# Patient Record
Sex: Female | Born: 1990 | Race: White | Hispanic: Yes | Marital: Single | Smoking: Never smoker
Health system: Southern US, Community
[De-identification: ages and names within clinical notes are randomized; demographics above are authoritative.]

## PROBLEM LIST (undated history)

## (undated) DIAGNOSIS — K219 Gastro-esophageal reflux disease without esophagitis: Secondary | ICD-10-CM

## (undated) HISTORY — DX: Gastro-esophageal reflux disease without esophagitis: K21.9

---

## 2013-05-05 ENCOUNTER — Encounter (HOSPITAL_COMMUNITY): Payer: Self-pay | Admitting: Emergency Medicine

## 2013-05-05 ENCOUNTER — Emergency Department (HOSPITAL_COMMUNITY)
Admission: EM | Admit: 2013-05-05 | Discharge: 2013-05-06 | Disposition: A | Discharge status: Home / Self Care | Payer: BC Managed Care – PPO | Attending: Emergency Medicine | Admitting: Emergency Medicine

## 2013-05-05 DIAGNOSIS — F17200 Nicotine dependence, unspecified, uncomplicated: Secondary | ICD-10-CM | POA: Insufficient documentation

## 2013-05-05 DIAGNOSIS — Z3202 Encounter for pregnancy test, result negative: Secondary | ICD-10-CM | POA: Insufficient documentation

## 2013-05-05 DIAGNOSIS — F10929 Alcohol use, unspecified with intoxication, unspecified: Secondary | ICD-10-CM

## 2013-05-05 DIAGNOSIS — F1010 Alcohol abuse, uncomplicated: Secondary | ICD-10-CM | POA: Insufficient documentation

## 2013-05-05 MED ORDER — ONDANSETRON HCL 4 MG/2ML IJ SOLN
4.0000 mg | Freq: Once | INTRAMUSCULAR | Status: AC
  Administered 2013-05-06: 4 mg via INTRAVENOUS
  Filled 2013-05-05: qty 2

## 2013-05-05 MED ORDER — NALOXONE HCL 0.4 MG/ML IJ SOLN
0.4000 mg | Freq: Once | INTRAMUSCULAR | Status: AC
  Administered 2013-05-06: 0.4 mg via INTRAVENOUS
  Filled 2013-05-05: qty 1

## 2013-05-05 MED ORDER — SODIUM CHLORIDE 0.9 % IV BOLUS (SEPSIS)
1000.0000 mL | Freq: Once | INTRAVENOUS | Status: AC
  Administered 2013-05-05: 1000 mL via INTRAVENOUS

## 2013-05-05 NOTE — ED Notes (Signed)
Bed: [id number] Expected date:  Expected time:  Means of arrival:  Comments: EMS ETOH, dec LOC

## 2013-05-05 NOTE — ED Notes (Signed)
Pt arrived to the ED via EMS with a complaint of alcohol intoxication.  Pt was going home with friends after drinking tequela when she passed out and became unresponsive.  She has been unresponsive for EMS.  EMS put a nasal airway in which they stated the pt responded to.  Pt has no identification but friends are present.

## 2013-05-05 NOTE — ED Provider Notes (Signed)
CSN: [id number]     Arrival date & time 05/05/13  2338 History   First MD Initiated Contact with Patient 05/05/13 2347     Chief Complaint  Patient presents with  . Alcohol Intoxication     (Consider location/radiation/quality/duration/timing/severity/associated sxs/prior Treatment) HPI History provided by EMS. Call out for alcohol intoxication, was apparently at a college party drinking with friends when she passed out and became unresponsive. Per EMS patient not answering questions, tolerated a nasal pharyngeal airway, and was brought into the emergency room. Friends bedside do not believe that she fell or injured herself are unable to say. There also unaware of any other potential ingestions. Level V caveat applies - patient unresponsive and unable to provide any history  History reviewed. No pertinent past medical history. History reviewed. No pertinent past surgical history. History reviewed. No pertinent family history. History  Substance Use Topics  . Smoking status: Current Some Day Smoker  . Smokeless tobacco: Not on file  . Alcohol Use: Yes   OB History   Grav Para Term Preterm Abortions TAB SAB Ect Mult Living                 Review of Systems  Unable to perform ROS  Level V caveat applies as above   Allergies  Review of patient's allergies indicates not on file.  Home Medications  No current outpatient prescriptions on file. BP 114/60  Pulse 66  Resp 16  SpO2 98% Physical Exam  Constitutional: She appears well-developed and well-nourished.  HENT:  Head: Normocephalic and atraumatic.  Nasopharyngeal airway in place  Eyes: EOM are normal.  Pupils 3 mm equal bilaterally  Neck: Neck supple. No tracheal deviation present.  Cardiovascular: Normal rate, regular rhythm and intact distal pulses.   Pulmonary/Chest: Effort normal and breath sounds normal. No stridor. No respiratory distress.  Abdominal: Soft. She exhibits no distension.  Musculoskeletal:  Equal  peripheral pulses without any obvious areas of deformity or abrasions   Neurological:  Unresponsive, intoxicated  Skin: Skin is warm and dry.    ED Course  Procedures (including critical care time) Labs Review Labs Reviewed  CBC - Abnormal; Notable for the following:    WBC 10.8 (*)    All other components within normal limits  URINALYSIS, ROUTINE W REFLEX MICROSCOPIC - Abnormal; Notable for the following:    Hgb urine dipstick TRACE (*)    All other components within normal limits  ETHANOL - Abnormal; Notable for the following:    Alcohol, Ethyl (B) 329 (*)    All other components within normal limits  POCT I-STAT, CHEM 8 - Abnormal; Notable for the following:    Creatinine, Ser 1.20 (*)    Glucose, Bld 106 (*)    Calcium, Ion 1.00 (*)    All other components within normal limits  URINE RAPID DRUG SCREEN (HOSP PERFORMED)  URINE MICROSCOPIC-ADD ON  POCT PREGNANCY, URINE   Imaging Review Ct Head Wo Contrast  05/06/2013   CLINICAL DATA:  Alcohol intoxication, syncope  EXAM: CT HEAD WITHOUT CONTRAST  TECHNIQUE: Contiguous axial images were obtained from the base of the skull through the vertex without intravenous contrast.  COMPARISON:  None.  FINDINGS: Negative for acute intracranial hemorrhage, acute infarction, mass, mass effect, hydrocephalus or midline shift. Gray-white differentiation is preserved throughout. No acute soft tissue or calvarial abnormality. The globes and orbits are symmetric and unremarkable. Normal aeration of the mastoid air cells and visualized paranasal sinuses.  IMPRESSION: Negative head CT.  Electronically Signed   By: Malachy MoanHeath  McCullough M.D.   On: 05/06/2013 02:25   CRITICAL CARE Performed by: Sunnie NielsenPITZ,Zion Lint Total critical care time: 45 Critical care time was exclusive of separately billable procedures and treating other patients. Critical care was necessary to treat or prevent imminent or life-threatening deterioration. Critical care was time spent  personally by me on the following activities: development of treatment plan with patient and/or surrogate as well as nursing, discussions with consultants, evaluation of patient's response to treatment, examination of patient, obtaining history from patient or surrogate, ordering and performing treatments and interventions, ordering and review of laboratory studies, ordering and review of radiographic studies, pulse oximetry and re-evaluation of patient's condition. IV fluids provided. IV Narcan without response. CT brain obtained no intracranial catastrophe. 3:07 AM recheck unchanged, blood alcohol level noted as above 329 6:13 AM patient waking up some, answering questions appropriately, moving all extremities x4. Plan allow patient to sober and reassess.  MDM   Diagnosis: Alcohol intoxication  Workup as above for unresponsiveness with IV Narcan, IV fluids, IV Zofran, CT head, labs as above. Alcohol 329.   Sunnie NielsenBrian Takiyah Bohnsack, MD 05/08/13 [phone]

## 2013-05-06 ENCOUNTER — Emergency Department (HOSPITAL_COMMUNITY): Payer: BC Managed Care – PPO

## 2013-05-06 LAB — RAPID URINE DRUG SCREEN, HOSP PERFORMED
Amphetamines: NOT DETECTED
Barbiturates: NOT DETECTED
Benzodiazepines: NOT DETECTED
Cocaine: NOT DETECTED
Opiates: NOT DETECTED
TETRAHYDROCANNABINOL: NOT DETECTED

## 2013-05-06 LAB — CBC
HCT: 39.2 % (ref 36.0–46.0)
Hemoglobin: 13 g/dL (ref 12.0–15.0)
MCH: 30.4 pg (ref 26.0–34.0)
MCHC: 33.2 g/dL (ref 30.0–36.0)
MCV: 91.6 fL (ref 78.0–100.0)
PLATELETS: 265 10*3/uL (ref 150–400)
RBC: 4.28 MIL/uL (ref 3.87–5.11)
RDW: 12.2 % (ref 11.5–15.5)
WBC: 10.8 10*3/uL — ABNORMAL HIGH (ref 4.0–10.5)

## 2013-05-06 LAB — URINALYSIS, ROUTINE W REFLEX MICROSCOPIC
Bilirubin Urine: NEGATIVE
Glucose, UA: NEGATIVE mg/dL
Ketones, ur: NEGATIVE mg/dL
Leukocytes, UA: NEGATIVE
NITRITE: NEGATIVE
Protein, ur: NEGATIVE mg/dL
Specific Gravity, Urine: 1.02 (ref 1.005–1.030)
Urobilinogen, UA: 0.2 mg/dL (ref 0.0–1.0)
pH: 5 (ref 5.0–8.0)

## 2013-05-06 LAB — POCT I-STAT, CHEM 8
BUN: 18 mg/dL (ref 6–23)
CALCIUM ION: 1 mmol/L — AB (ref 1.12–1.23)
Chloride: 107 mEq/L (ref 96–112)
Creatinine, Ser: 1.2 mg/dL — ABNORMAL HIGH (ref 0.50–1.10)
Glucose, Bld: 106 mg/dL — ABNORMAL HIGH (ref 70–99)
HCT: 41 % (ref 36.0–46.0)
HEMOGLOBIN: 13.9 g/dL (ref 12.0–15.0)
Potassium: 4.5 mEq/L (ref 3.7–5.3)
SODIUM: 143 meq/L (ref 137–147)
TCO2: 23 mmol/L (ref 0–100)

## 2013-05-06 LAB — POCT PREGNANCY, URINE: Preg Test, Ur: NEGATIVE

## 2013-05-06 LAB — URINE MICROSCOPIC-ADD ON

## 2013-05-06 LAB — ETHANOL: Alcohol, Ethyl (B): 329 mg/dL — ABNORMAL HIGH (ref 0–11)

## 2013-05-06 MED ORDER — ONDANSETRON HCL 4 MG PO TABS: 4.0000 mg | ORAL_TABLET | Freq: Four times a day (QID) | ORAL | Status: DC

## 2013-05-06 NOTE — ED Notes (Signed)
Pt to nearby restroom via wheelchair to void and ambulated from bathroom to room/on way back.

## 2013-05-06 NOTE — ED Notes (Signed)
Nursing attempted to call Pt's friend to come and pick them up.  Pt's friend did not respond to call

## 2013-05-06 NOTE — ED Notes (Signed)
Pt's friend was tried to call again but again no response.

## 2013-05-06 NOTE — Discharge Instructions (Signed)
Alcohol Problems °Most adults who drink alcohol drink in moderation (not a lot) are at low risk for developing problems related to their drinking. However, all drinkers, including low-risk drinkers, should know about the health risks connected with drinking alcohol. °RECOMMENDATIONS FOR LOW-RISK DRINKING  °Drink in moderation. Moderate drinking is defined as follows:  °· Men - no more than 2 drinks per day. °· Nonpregnant women - no more than 1 drink per day. °· Over age 65 - no more than 1 drink per day. °A standard drink is 12 grams of pure alcohol, which is equal to a 12 ounce bottle of beer or wine cooler, a 5 ounce glass of wine, or 1.5 ounces of distilled spirits (such as whiskey, brandy, vodka, or rum).  °ABSTAIN FROM (DO NOT DRINK) ALCOHOL: °· When pregnant or considering pregnancy. °· When taking a medication that interacts with alcohol. °· If you are alcohol dependent. °· A medical condition that prohibits drinking alcohol (such as ulcer, liver disease, or heart disease). °DISCUSS WITH YOUR CAREGIVER: °· If you are at risk for coronary heart disease, discuss the potential benefits and risks of alcohol use: Light to moderate drinking is associated with lower rates of coronary heart disease in certain populations (for example, men over age 45 and postmenopausal women). Infrequent or nondrinkers are advised not to begin light to moderate drinking to reduce the risk of coronary heart disease so as to avoid creating an alcohol-related problem. Similar protective effects can likely be gained through proper diet and exercise. °· Women and the elderly have smaller amounts of body water than men. As a result women and the elderly achieve a higher blood alcohol concentration after drinking the same amount of alcohol. °· Exposing a fetus to alcohol can cause a broad range of birth defects referred to as Fetal Alcohol Syndrome (FAS) or Alcohol-Related Birth Defects (ARBD). Although FAS/ARBD is connected with excessive  alcohol consumption during pregnancy, studies also have reported neurobehavioral problems in infants born to mothers reporting drinking an average of 1 drink per day during pregnancy. °· Heavier drinking (the consumption of more than 4 drinks per occasion by men and more than 3 drinks per occasion by women) impairs learning (cognitive) and psychomotor functions and increases the risk of alcohol-related problems, including accidents and injuries. °CAGE QUESTIONS:  °· Have you ever felt that you should Cut down on your drinking? °· Have people Annoyed you by criticizing your drinking? °· Have you ever felt bad or Guilty about your drinking? °· Have you ever had a drink first thing in the morning to steady your nerves or get rid of a hangover (Eye opener)? °If you answered positively to any of these questions: You may be at risk for alcohol-related problems if alcohol consumption is:  °· Men: Greater than 14 drinks per week or more than 4 drinks per occasion. °· Women: Greater than 7 drinks per week or more than 3 drinks per occasion. °Do you or your family have a medical history of alcohol-related problems, such as: °· Blackouts. °· Sexual dysfunction. °· Depression. °· Trauma. °· Liver dysfunction. °· Sleep disorders. °· Hypertension. °· Chronic abdominal pain. °· Has your drinking ever caused you problems, such as problems with your family, problems with your work (or school) performance, or accidents/injuries? °· Do you have a compulsion to drink or a preoccupation with drinking? °· Do you have poor control or are you unable to stop drinking once you have started? °· Do you have to drink to   avoid withdrawal symptoms? °· Do you have problems with withdrawal such as tremors, nausea, sweats, or mood disturbances? °· Does it take more alcohol than in the past to get you high? °· Do you feel a strong urge to drink? °· Do you change your plans so that you can have a drink? °· Do you ever drink in the morning to relieve  the shakes or a hangover? °If you have answered a number of the previous questions positively, it may be time for you to talk to your caregivers, family, and friends and see if they think you have a problem. Alcoholism is a chemical dependency that keeps getting worse and will eventually destroy your health and relationships. Many alcoholics end up dead, impoverished, or in prison. This is often the end result of all chemical dependency. °· Do not be discouraged if you are not ready to take action immediately. °· Decisions to change behavior often involve up and down desires to change and feeling like you cannot decide. °· Try to think more seriously about your drinking behavior. °· Think of the reasons to quit. °WHERE TO GO FOR ADDITIONAL INFORMATION  °· The National Institute on Alcohol Abuse and Alcoholism (NIAAA) °www.niaaa.nih.gov °· National Council on Alcoholism and Drug Dependence (NCADD) °www.ncadd.org °· American Society of Addiction Medicine (ASAM) °www.asam.org  °Document Released: 03/09/2005 Document Revised: 06/01/2011 Document Reviewed: 10/26/2007 °ExitCare® Patient Information ©2014 [employer]. ° °

## 2013-05-06 NOTE — ED Notes (Signed)
Pt's friend arrived and asked when the possible discharge of the pt would be.  Pt's friend was informed that it was undetermined at this time but that patient would have to be more coherent in order to be discharged.   Pt's friend left her name and number and asked that we call her when pt was discharged and she would pick her up and take her home.  Pt's friend stated she is a [employer] student [place].    Pt's friends name is: Riley Killlicia Coteman [phone]

## 2013-05-24 ENCOUNTER — Encounter (HOSPITAL_COMMUNITY): Payer: Self-pay | Admitting: Emergency Medicine

## 2013-05-24 ENCOUNTER — Emergency Department (HOSPITAL_COMMUNITY)
Admission: EM | Admit: 2013-05-24 | Discharge: 2013-05-24 | Disposition: A | Discharge status: Home / Self Care | Payer: BC Managed Care – HMO | Attending: Emergency Medicine | Admitting: Emergency Medicine

## 2013-05-24 ENCOUNTER — Emergency Department (HOSPITAL_COMMUNITY): Payer: BC Managed Care – HMO

## 2013-05-24 DIAGNOSIS — Z3202 Encounter for pregnancy test, result negative: Secondary | ICD-10-CM | POA: Insufficient documentation

## 2013-05-24 DIAGNOSIS — K8020 Calculus of gallbladder without cholecystitis without obstruction: Secondary | ICD-10-CM | POA: Insufficient documentation

## 2013-05-24 LAB — CBC WITH DIFFERENTIAL/PLATELET
Basophils Absolute: 0 10*3/uL (ref 0.0–0.1)
Basophils Relative: 0 % (ref 0–1)
Eosinophils Absolute: 0.1 10*3/uL (ref 0.0–0.7)
Eosinophils Relative: 1 % (ref 0–5)
HEMATOCRIT: 38.1 % (ref 36.0–46.0)
Hemoglobin: 13.2 g/dL (ref 12.0–15.0)
LYMPHS PCT: 17 % (ref 12–46)
Lymphs Abs: 1.7 10*3/uL (ref 0.7–4.0)
MCH: 31.7 pg (ref 26.0–34.0)
MCHC: 34.6 g/dL (ref 30.0–36.0)
MCV: 91.4 fL (ref 78.0–100.0)
MONOS PCT: 6 % (ref 3–12)
Monocytes Absolute: 0.5 10*3/uL (ref 0.1–1.0)
NEUTROS ABS: 7.5 10*3/uL (ref 1.7–7.7)
Neutrophils Relative %: 77 % (ref 43–77)
Platelets: 221 10*3/uL (ref 150–400)
RBC: 4.17 MIL/uL (ref 3.87–5.11)
RDW: 12.4 % (ref 11.5–15.5)
WBC: 9.7 10*3/uL (ref 4.0–10.5)

## 2013-05-24 LAB — LIPASE, BLOOD: Lipase: 38 U/L (ref 11–59)

## 2013-05-24 LAB — URINALYSIS, ROUTINE W REFLEX MICROSCOPIC
Glucose, UA: NEGATIVE mg/dL
KETONES UR: NEGATIVE mg/dL
LEUKOCYTES UA: NEGATIVE
Nitrite: NEGATIVE
PROTEIN: 30 mg/dL — AB
Specific Gravity, Urine: 1.031 — ABNORMAL HIGH (ref 1.005–1.030)
Urobilinogen, UA: 0.2 mg/dL (ref 0.0–1.0)
pH: 5.5 (ref 5.0–8.0)

## 2013-05-24 LAB — HEPATIC FUNCTION PANEL
ALK PHOS: 64 U/L (ref 39–117)
ALT: 29 U/L (ref 0–35)
AST: 50 U/L — AB (ref 0–37)
Albumin: 3.7 g/dL (ref 3.5–5.2)
BILIRUBIN TOTAL: 0.4 mg/dL (ref 0.3–1.2)
Total Protein: 7.4 g/dL (ref 6.0–8.3)

## 2013-05-24 LAB — BASIC METABOLIC PANEL
BUN: 11 mg/dL (ref 6–23)
CALCIUM: 9.3 mg/dL (ref 8.4–10.5)
CO2: 24 meq/L (ref 19–32)
Chloride: 104 mEq/L (ref 96–112)
Creatinine, Ser: 0.69 mg/dL (ref 0.50–1.10)
GFR calc Af Amer: >90 mL/min (ref >90)
Glucose, Bld: 110 mg/dL — ABNORMAL HIGH (ref 70–99)
Potassium: 3.7 mEq/L (ref 3.7–5.3)
Sodium: 142 mEq/L (ref 137–147)

## 2013-05-24 LAB — URINE MICROSCOPIC-ADD ON

## 2013-05-24 LAB — POC URINE PREG, ED: Preg Test, Ur: NEGATIVE

## 2013-05-24 MED ORDER — OMEPRAZOLE 20 MG PO CPDR: 20.0000 mg | DELAYED_RELEASE_CAPSULE | Freq: Every day | ORAL | Status: AC

## 2013-05-24 MED ORDER — DICYCLOMINE HCL 20 MG PO TABS: 20.0000 mg | ORAL_TABLET | Freq: Two times a day (BID) | ORAL | Status: AC

## 2013-05-24 NOTE — ED Notes (Addendum)
crampy pain in abd x 6 months has seen a dr  And given meds the last time she was seen  but has only taken 2 of them LMp now  Denies dysuria now had UTI last time states took her meds for that  Denies vag d/c last bm yesterday  No n/v/d has no pcp

## 2013-05-24 NOTE — ED Provider Notes (Signed)
CSN: [id number]     Arrival date & time 05/24/13  [id number] History   First MD Initiated Contact with Patient 05/24/13 0920     Chief Complaint  Patient presents with  . Abdominal Pain     (Consider location/radiation/quality/duration/timing/severity/associated sxs/prior Treatment) HPI Comments: Patient is an otherwise healthy 23 year old female who presents today with abdominal pain. She reports that for the past 6 months she has been waking up with epigastric cramping. The cramping starts during the night and is worse in the morning. Today she is here because the pain is more severe than normal. Usually the pain goes away around 8 AM, but it has lingered. She took Bentyl without relief of her symptoms. She was evaluated for this 2 [place] ago [place] They told her at that time it was gas. Her symptoms have persisted. She denies fever, chills, nausea, vomiting, diarrhea, chest pain, shortness of breath.  Patient is a 23 y.o. female presenting with abdominal pain. The history is provided by the patient. No language interpreter was used.  Abdominal Pain Associated symptoms: no chest pain, no chills, no constipation, no diarrhea, no dysuria, no fever, no nausea, no shortness of breath and no vomiting     No past medical history on file. History reviewed. No pertinent past surgical history. No family history on file. History  Substance Use Topics  . Smoking status: Never Smoker   . Smokeless tobacco: Not on file  . Alcohol Use: Yes   OB History   Grav Para Term Preterm Abortions TAB SAB Ect Mult Living                 Review of Systems  Constitutional: Negative for fever and chills.  Respiratory: Negative for shortness of breath.   Cardiovascular: Negative for chest pain.  Gastrointestinal: Positive for abdominal pain. Negative for nausea, vomiting, diarrhea and constipation.  Genitourinary: Negative for dysuria.  All other systems reviewed and are  negative.      Allergies  Review of patient's allergies indicates no known allergies.  Home Medications   Current Outpatient Rx  Name  Route  Sig  Dispense  Refill  . ondansetron (ZOFRAN) 4 MG tablet   Oral   Take 1 tablet (4 mg total) by mouth every 6 (six) hours.   12 tablet   0    BP 105/57  Pulse 57  Temp(Src) 97.4 F (36.3 C) (Oral)  Resp 18  Ht 5\' 6"  (1.676 m)  SpO2 100% Physical Exam  Nursing note and vitals reviewed. Constitutional: She is oriented to person, place, and time. She appears well-developed and well-nourished.  Non-toxic appearance. She does not have a sickly appearance. She does not appear ill. No distress.  HENT:  Head: Normocephalic and atraumatic.  Right Ear: External ear normal.  Left Ear: External ear normal.  Nose: Nose normal.  Mouth/Throat: Oropharynx is clear and moist.  Eyes: Conjunctivae are normal.  Neck: Normal range of motion.  Cardiovascular: Normal rate, regular rhythm and normal heart sounds.   Pulmonary/Chest: Effort normal and breath sounds normal. No stridor. No respiratory distress. She has no wheezes. She has no rales.  Abdominal: Soft. Normal appearance and bowel sounds are normal. She exhibits no distension. There is no tenderness. There is no rigidity, no rebound and no guarding.  No tenderness to deep palpation  Musculoskeletal: Normal range of motion.  Neurological: She is alert and oriented to person, place, and time. She has normal strength.  Skin: Skin is  warm and dry. She is not diaphoretic. No erythema.  Psychiatric: She has a normal mood and affect. Her behavior is normal.    ED Course  Procedures (including critical care time) Labs Review Labs Reviewed  BASIC METABOLIC PANEL - Abnormal; Notable for the following:    Glucose, Bld 110 (*)    All other components within normal limits  URINALYSIS, ROUTINE W REFLEX MICROSCOPIC - Abnormal; Notable for the following:    Color, Urine AMBER (*)    APPearance CLOUDY  (*)    Specific Gravity, Urine 1.031 (*)    Hgb urine dipstick LARGE (*)    Bilirubin Urine SMALL (*)    Protein, ur 30 (*)    All other components within normal limits  HEPATIC FUNCTION PANEL - Abnormal; Notable for the following:    AST 50 (*)    All other components within normal limits  CBC WITH DIFFERENTIAL  LIPASE, BLOOD  URINE MICROSCOPIC-ADD ON  POC URINE PREG, ED   Imaging Review US Abdomen Complete  05/24/2013   CLINICAL DATA:  Abdominal pain  EXAM: ULTRASOUND ABDOMEN COMPLETE  COMPARISON:  None.  FINDINGS: Gallbladder:  Within the gallbladder there are several echogenic foci which move and shadow consistent with gallstones. Largest gallstone measures 1.5 cm in length. Sludge is also present in the gallbladder. The gallbladder wall is mildly thickened. There is no pericholecystic fluid. No sonographic Murphy sign noted.  Common bile duct:  Diameter: 4 mm. There is no intrahepatic, common hepatic, or common bile duct dilatation.  Liver:  No focal lesion identified. Within normal limits in parenchymal echogenicity.  IVC:  No abnormality visualized.  Pancreas:  Visualized portion unremarkable. Portions of pancreas are obscured by gas.  Spleen:  Size and appearance within normal limits.  Right Kidney:  Length: 11.7 cm. Echogenicity within normal limits. No mass or hydronephrosis visualized.  Left Kidney:  Length: 11.1 cm. Echogenicity within normal limits. No mass or hydronephrosis visualized.  Abdominal aorta:  No aneurysm visualized.  Other findings:  No demonstrable ascites.  IMPRESSION: Cholelithiasis and sludge in gallbladder. Gallbladder wall is mildly thickened. Early cholecystitis is questioned.  Portions of pancreas are obscured by gas. Visualized portions of pancreas appear normal.  Study otherwise unremarkable.   Electronically Signed   By: Bretta Bang M.D.   On: 05/24/2013 11:12     EKG Interpretation None      MDM   Final diagnoses:  Cholelithiasis   Patient  presents to ED with epigastric cramping x 6 months. [place] of abd shows cholelithiasis and sludge in the gallbladder. [place] raises question of early cholecystitis. Clinically patient does not have cholecystitis. There is no tenderness to deep palpation. Patient is afebrile without elevated WBC or LFTs. Discussed these results and a referral to surgery was given. Reasons to return to the ED were discussed. Dr. Freida Busman evaluated this patient and agrees with plan. Vital signs stable for discharge. Patient / Family / Caregiver informed of clinical course, understand medical decision-making process, and agree with plan.     Mora Bellman, PA-C 05/24/13 1622

## 2013-05-24 NOTE — Discharge Instructions (Signed)
Cholelithiasis °Cholelithiasis (also called gallstones) is a form of gallbladder disease in which gallstones form in your gallbladder. The gallbladder is an organ that stores bile made in the liver, which helps digest fats. Gallstones begin as small crystals and slowly grow into stones. Gallstone pain occurs when the gallbladder spasms and a gallstone is blocking the duct. Pain can also occur when a stone passes out of the duct.  °RISK FACTORS °· Being female.   °· Having multiple pregnancies. Health care providers sometimes advise removing diseased gallbladders before future pregnancies.   °· Being obese. °· Eating a diet heavy in fried foods and fat.   °· Being older than 60 years and increasing age.   °· Prolonged use of medicines containing female hormones.   °· Having diabetes mellitus.   °· Rapidly losing weight.   °· Having a family history of gallstones (heredity).   °SYMPTOMS °· Nausea.   °· Vomiting. °· Abdominal pain.   °· Yellowing of the skin (jaundice).   °· Sudden pain. It may persist from several minutes to several hours. °· Fever.   °· Tenderness to the touch.  °In some cases, when gallstones do not move into the bile duct, people have no pain or symptoms. These are called "silent" gallstones.  °TREATMENT °Silent gallstones do not need treatment. In severe cases, emergency surgery may be required. Options for treatment include: °· Surgery to remove the gallbladder. This is the most common treatment. °· Medicines. These do not always work and may take 6 12 months or more to work. °· Shock wave treatment (extracorporeal biliary lithotripsy). In this treatment an ultrasound machine sends shock waves to the gallbladder to break gallstones into smaller pieces that can pass into the intestines or be dissolved by medicine. °HOME CARE INSTRUCTIONS  °· Only take over-the-counter or prescription medicines for pain, discomfort, or fever as directed by your health care provider.   °· Follow a low-fat diet until  seen again by your health care provider. Fat causes the gallbladder to contract, which can result in pain.   °· Follow up with your health care provider as directed. Attacks are almost always recurrent and surgery is usually required for permanent treatment.   °SEEK IMMEDIATE MEDICAL CARE IF:  °· Your pain increases and is not controlled by medicines.   °· You have a fever or persistent symptoms for more than 2 3 days.   °· You have a fever and your symptoms suddenly get worse.   °· You have persistent nausea and vomiting.   °MAKE SURE YOU:  °· Understand these instructions. °· Will watch your condition. °· Will get help right away if you are not doing well or get worse. °Document Released: 03/05/2005 Document Revised: 11/09/2012 Document Reviewed: 08/31/2012 °ExitCare® Patient Information ©2014 [employer]. ° °

## 2013-05-24 NOTE — ED Notes (Signed)
Called lab, reporting they will add on recently ordered blood.

## 2013-05-24 NOTE — ED Provider Notes (Signed)
Medical screening examination/treatment/procedure(s) were conducted as a shared visit with non-physician practitioner(s) and myself.  I personally evaluated the patient during the encounter.   EKG Interpretation None     Patient here with pain in the epigastric area is worse at night. Abdominal ultrasound showed cholelithiasis with possible cholecystitis. Her abdominal exam here is benign. She has no rub quadrant tenderness. No rebound or guarding. She has a nonsurgical abdomen. We'll give her referral to general surgery and placed on medications for GERD.  Toy BakerAnthony T Marquee Fuchs, MD 05/24/13 1215

## 2013-05-24 NOTE — ED Notes (Signed)
Pt remains in US but family is at bedside.

## 2013-05-27 NOTE — ED Provider Notes (Signed)
Medical screening examination/treatment/procedure(s) were performed by non-physician practitioner and as supervising physician I was immediately available for consultation/collaboration.   Camiya Vinal T Ariela Mochizuki, MD 05/27/13 1551 

## 2013-06-02 ENCOUNTER — Ambulatory Visit (INDEPENDENT_AMBULATORY_CARE_PROVIDER_SITE_OTHER): Payer: BC Managed Care – PPO | Admitting: Surgery

## 2013-06-02 ENCOUNTER — Encounter (INDEPENDENT_AMBULATORY_CARE_PROVIDER_SITE_OTHER): Payer: Self-pay | Admitting: Surgery

## 2013-06-02 VITALS — BP 106/70 | HR 76 | Temp 98.7°F | Resp 16 | Ht 62.0 in | Wt 168.4 lb

## 2013-06-02 DIAGNOSIS — K8010 Calculus of gallbladder with chronic cholecystitis without obstruction: Secondary | ICD-10-CM

## 2013-06-02 NOTE — Progress Notes (Signed)
Patient ID: Margaret Thornton, female   DOB: December 13, 1990, 23 y.o.   MRN: [id number]  Chief Complaint  Patient presents with  . Cholelithiasis    new patient    HPI Margaret FillersGloria Groh is a 23 y.o. female.  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 She cannot determine any exacerbating factors. These symptoms have been intermittent but seem to be occurring more frequently. She does have some associated nausea and abdominal bloating but no diarrhea or vomiting. She had one severe attack last week and went to the emergency room. She was found to have cholelithiasis with some mild gallbladder wall thickening. Her white count liver function tests were normal. She was discharged and presents now for surgical evaluation. Currently she is asymptomatic.   Past Medical History  Diagnosis Date  . GERD (gastroesophageal reflux disease)     History reviewed. No pertinent past surgical history.  Family History  Problem Relation Age of Onset  . Diabetes Father     Social History History  Substance Use Topics  . Smoking status: Never Smoker   . Smokeless tobacco: Not on file  . Alcohol Use: Yes     Comment: occ    Allergies  Allergen Reactions  . Shellfish Allergy Hives    Current Outpatient Prescriptions  Medication Sig Dispense Refill  . dicyclomine (BENTYL) 20 MG tablet Take 1 tablet (20 mg total) by mouth 2 (two) times daily.  20 tablet  0  . omeprazole (PRILOSEC) 20 MG capsule Take 1 capsule (20 mg total) by mouth daily.  15 capsule  0   No current facility-administered medications for this visit.    Review of Systems Review of Systems  Constitutional: Negative for fever, chills and unexpected weight change.  HENT: Negative for congestion, hearing loss, sore throat, trouble swallowing and voice change.   Eyes: Negative for visual  disturbance.  Respiratory: Negative for cough and wheezing.   Cardiovascular: Negative for chest pain, palpitations and leg swelling.  Gastrointestinal: Positive for nausea, abdominal pain and abdominal distention. Negative for vomiting, diarrhea, constipation, blood in stool and anal bleeding.  Genitourinary: Negative for hematuria, vaginal bleeding and difficulty urinating.  Musculoskeletal: Negative for arthralgias.  Skin: Negative for rash and wound.  Neurological: Negative for seizures, syncope and headaches.  Hematological: Negative for adenopathy. Does not bruise/bleed easily.  Psychiatric/Behavioral: Negative for confusion.    Blood pressure 106/70, pulse 76, temperature 98.7 F (37.1 C), temperature source Oral, resp. rate 16, height 5\' 2"  (1.575 m), weight 168 lb 6.4 oz (76.386 kg).  Physical Exam Physical Exam WDWN in NAD HEENT:  EOMI, sclera anicteric Neck:  No masses, no thyromegaly Lungs:  CTA bilaterally; normal respiratory effort CV:  Regular rate and rhythm; no murmurs Abd:  +bowel sounds, soft, non-tender, no masses Ext:  Well-perfused; no edema Skin:  Warm, dry; no sign of jaundice  Data Reviewed Ct Head Wo Contrast  05/06/2013   CLINICAL DATA:  Alcohol intoxication, syncope  EXAM: CT HEAD WITHOUT CONTRAST  TECHNIQUE: Contiguous axial images were obtained from the base of the skull through the vertex without intravenous contrast.  COMPARISON:  None.  FINDINGS: Negative for acute intracranial hemorrhage, acute infarction, mass, mass effect, hydrocephalus or midline shift. Gray-white differentiation is preserved throughout. No acute soft tissue or calvarial abnormality. The globes and orbits are symmetric and unremarkable. Normal aeration of  the mastoid air cells and visualized paranasal sinuses.  IMPRESSION: Negative head CT.   Electronically Signed   By: Malachy Moan M.D.   On: 05/06/2013 02:25   US Abdomen Complete  05/24/2013   CLINICAL DATA:  Abdominal pain   EXAM: ULTRASOUND ABDOMEN COMPLETE  COMPARISON:  None.  FINDINGS: Gallbladder:  Within the gallbladder there are several echogenic foci which move and shadow consistent with gallstones. Largest gallstone measures 1.5 cm in length. Sludge is also present in the gallbladder. The gallbladder wall is mildly thickened. There is no pericholecystic fluid. No sonographic Murphy sign noted.  Common bile duct:  Diameter: 4 mm. There is no intrahepatic, common hepatic, or common bile duct dilatation.  Liver:  No focal lesion identified. Within normal limits in parenchymal echogenicity.  IVC:  No abnormality visualized.  Pancreas:  Visualized portion unremarkable. Portions of pancreas are obscured by gas.  Spleen:  Size and appearance within normal limits.  Right Kidney:  Length: 11.7 cm. Echogenicity within normal limits. No mass or hydronephrosis visualized.  Left Kidney:  Length: 11.1 cm. Echogenicity within normal limits. No mass or hydronephrosis visualized.  Abdominal aorta:  No aneurysm visualized.  Other findings:  No demonstrable ascites.  IMPRESSION: Cholelithiasis and sludge in gallbladder. Gallbladder wall is mildly thickened. Early cholecystitis is questioned.  Portions of pancreas are obscured by gas. Visualized portions of pancreas appear normal.  Study otherwise unremarkable.   Electronically Signed   By: Bretta Bang M.D.   On: 05/24/2013 11:12    Lab Results  Component Value Date   WBC 9.7 05/24/2013   HGB 13.2 05/24/2013   HCT 38.1 05/24/2013   MCV 91.4 05/24/2013   PLT 221 05/24/2013   Lab Results  Component Value Date   ALT 29 05/24/2013   AST 50* 05/24/2013   ALKPHOS 64 05/24/2013   BILITOT 0.4 05/24/2013   Lab Results  Component Value Date   CREATININE 0.69 05/24/2013   BUN 11 05/24/2013   NA 142 05/24/2013   K 3.7 05/24/2013   CL 104 05/24/2013   CO2 24 05/24/2013   Lab Results  Component Value Date   LIPASE 38 05/24/2013     Assessment    Chronic calculus cholecystitis     Plan     Laparoscopic cholecystectomy with intraoperative cholangiogram. The surgical procedure has been discussed with the patient.  Potential risks, benefits, alternative treatments, and expected outcomes have been explained.  All of the patient's questions at this time have been answered.  The likelihood of reaching the patient's treatment goal is good.  The patient understand the proposed surgical procedure and wishes to proceed.         Neriyah Cercone K. 06/02/2013, 10:22 AM

## 2014-01-12 ENCOUNTER — Ambulatory Visit (INDEPENDENT_AMBULATORY_CARE_PROVIDER_SITE_OTHER): Payer: Self-pay | Admitting: Surgery

## 2014-01-12 NOTE — H&P (Signed)
History of Present Illness Margaret Thornton(Jerry Clyne K. Alaja Goldinger MD; 01/12/2014 9:29 AM) Patient words: recheck gallbladder, discuss removal.  The patient is a 23 year old female who presents for evaluation of gall stones. Margaret FillersGloria Thornton is a 23 y.o. female. 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 She cannot determine any exacerbating factors. These symptoms have been intermittent but seem to be occurring more frequently. She does have some associated nausea and abdominal bloating but no diarrhea or vomiting. She had one severe attack last week and went to the emergency room. She was found to have cholelithiasis with some mild gallbladder wall thickening. Her white count liver function tests were normal. She was discharged and presents now for surgical evaluation. Currently she is asymptomatic. She was originally evaluated in March 2015 but declined surgery at that time. She attempted to maintain a lowfat diet, but she still has intermittent attacks that last 2-4 hours. No further testing has been performed. 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    Other Problems Renato Gails(Margaret Thornton; 01/12/2014 9:11 AM) Cholelithiasis  Past Surgical History Renato Gails(Margaret Thornton; 01/12/2014 9:11 AM) No pertinent past surgical history  Diagnostic Studies History Renato Gails(Margaret Thornton; 01/12/2014 9:11 AM) Colonoscopy never Mammogram never Pap Smear never  Allergies Renato Gails(Margaret Thornton; 01/12/2014 9:13 AM) Hebert SohoShellfish  Social History [name](Margaret Thornton; 01/12/2014 9:11 AM) Alcohol use Occasional alcohol use. Caffeine use Carbonated beverages, Coffee, Tea. No drug use Tobacco use Never smoker.  Family History Renato Gails(Margaret Thornton; 01/12/2014 9:11 AM) Bleeding disorder Mother. Diabetes Mellitus Father. Hypertension Father. Thyroid  problems Mother.  Pregnancy / Birth History Renato Gails(Margaret Thornton; 01/12/2014 9:11 AM) Age at menarche 11 years. Gravida 0 Irregular periods Para 0  Review of Systems [name](Margaret [place] Browning[place]Millen; 01/12/2014 9:11 AM) General Present- Weight Gain. Not Present- Appetite Loss, Chills, Fatigue, Fever, Night Sweats and Weight Loss. Skin Not Present- Change in Wart/Mole, Dryness, Hives, Jaundice, New Lesions, Non-Healing Wounds, Rash and Ulcer. HEENT Present- Wears glasses/contact lenses. Not Present- Earache, Hearing Loss, Hoarseness, Nose Bleed, Oral Ulcers, Ringing in the Ears, Seasonal Allergies, Sinus Pain, Sore Throat, Visual Disturbances and Yellow Eyes. Respiratory Not Present- Bloody sputum, Chronic Cough, Difficulty Breathing, Snoring and Wheezing. Breast Not Present- Breast Mass, Breast Pain, Nipple Discharge and Skin Changes. Cardiovascular Not Present- Chest Pain, Difficulty Breathing Lying Down, Leg Cramps, Palpitations, Rapid Heart Rate, Shortness of Breath and Swelling of Extremities. Gastrointestinal Present- Abdominal Pain, Bloating, Change in Bowel Habits, Excessive gas and Hemorrhoids. Not Present- Bloody Stool, Chronic diarrhea, Constipation, Difficulty Swallowing, Gets full quickly at meals, Indigestion, Nausea, Rectal Pain and Vomiting. Female Genitourinary Present- Urgency. Not Present- Frequency, Nocturia, Painful Urination and Pelvic Pain. Musculoskeletal Not Present- Back Pain, Joint Pain, Joint Stiffness, Muscle Pain, Muscle Weakness and Swelling of Extremities. Neurological Not Present- Decreased Memory, Fainting, Headaches, Numbness, Seizures, Tingling, Tremor, Trouble walking and Weakness. Psychiatric Not Present- Anxiety, Bipolar, Change in Sleep Pattern, Depression, Fearful and Frequent crying. Endocrine Present- New Diabetes. Not Present- Cold Intolerance, Excessive Hunger, Hair Changes, Heat Intolerance and Hot flashes. Hematology Not Present- Easy Bruising, Excessive bleeding,  Gland problems, HIV and Persistent Infections.   Vitals [name](Margaret BelmontMcMillen; 01/12/2014 9:13 AM) 01/12/2014 9:13 AM Weight: 171.13 lb Height: 62in Body Surface Area: 1.84 m Body Mass Index: 31.3 kg/m Temp.: 98.46F(Tympanic)  Pulse: 76 (Regular)  Resp.: 14 (Unlabored)  BP: 120/76 (Sitting, Left Arm, Standard)  Physical Exam [name]([name]; 01/12/2014 9:32 AM) The physical exam findings are as follows: Note:WDWN in NAD HEENT: EOMI, sclera anicteric Neck: No masses, no thyromegaly Lungs: CTA bilaterally; normal respiratory effort CV: Regular rate and rhythm; no murmurs Abd: +bowel sounds, soft, non-tender, no masses Ext: Well-perfused; no edema Skin: Warm, dry; no sign of jaundice    Assessment & Plan [name]([name]; 01/12/2014 9:30 AM) Cholelithiasis CHRONIC CHOLECYSTITIS WITH CALCULUS (574.10  K80.10) Current Plans  Schedule for Surgery Laparoscopic cholecystectomy with intraoperative cholangiogram.  The surgical procedure has been discussed with the patient. Potential risks, benefits, alternative treatments, and expected outcomes have been explained. All of the patient's questions at this time have been answered. The likelihood of reaching the patient's treatment goal is good. The patient understand the proposed surgical procedure and wishes to proceed.   Margaret ArmsMatthew K. Corliss Skainssuei, MD, [facility]  General/ Trauma Surgery  01/12/2014 9:34 AM

## 2015-07-05 IMAGING — CT CT HEAD W/O CM
2 series · 17 of 30 positions shown, 20 images · non-contrast
Comparison: None.

CLINICAL DATA: Alcohol intoxication, syncope

EXAM:
CT HEAD WITHOUT CONTRAST
TECHNIQUE: Contiguous axial images were obtained from the base of the skull
through the vertex without intravenous contrast.

[Series 2: head w/o · axial · non-contrast · 0.48mm/px · z∈[-179,-69]mm · 9 of 28 slices shown, 12 images]
[im 3/28  brain]
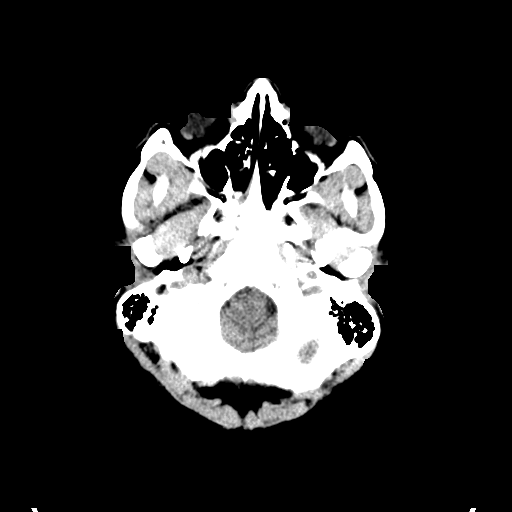
[im 3/28  bone]
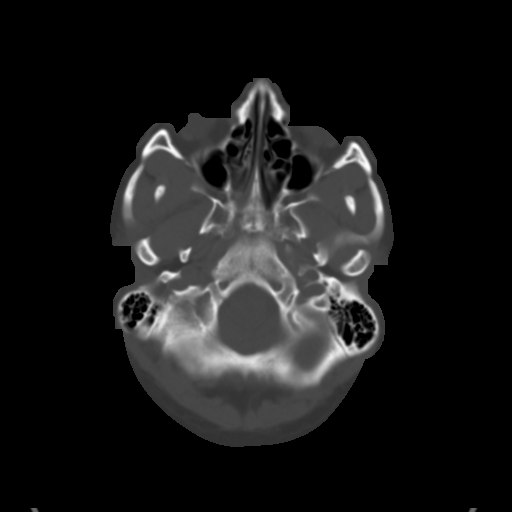
[im 6/28  brain]
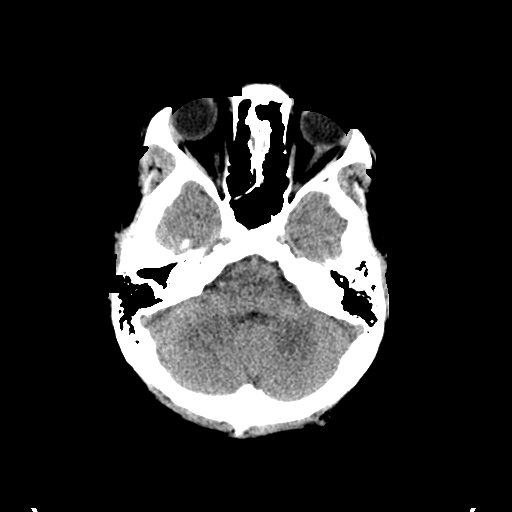
[im 9/28  brain]
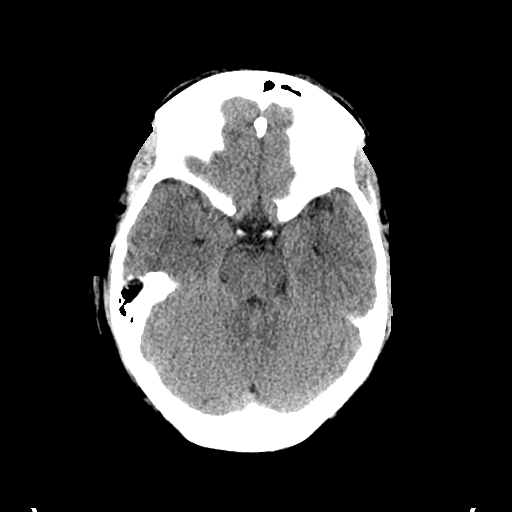
[im 11/28  brain]
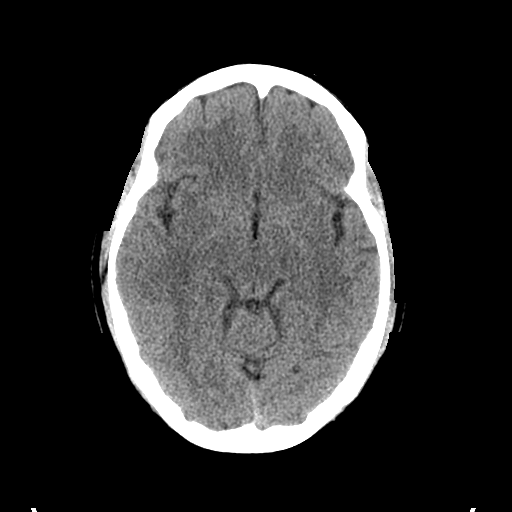
[im 14/28  brain]
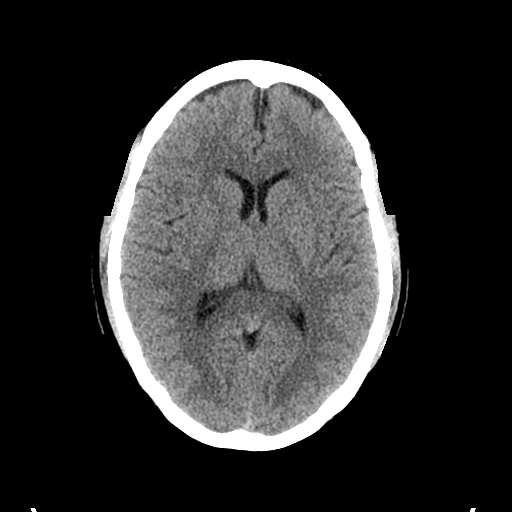
[im 14/28  bone]
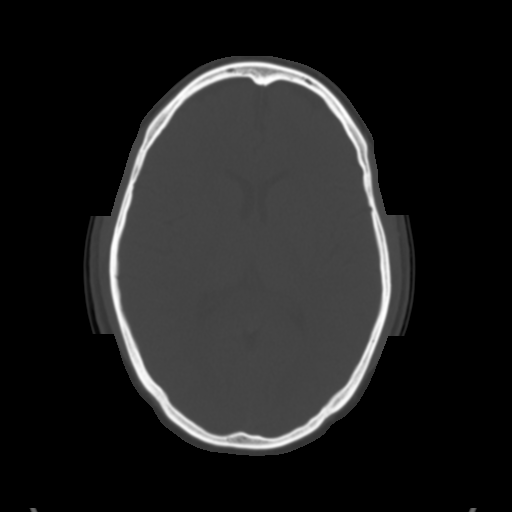
[im 17/28  brain]
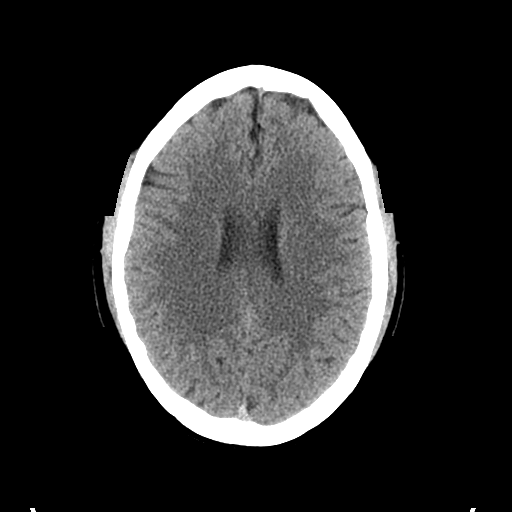
[im 19/28  brain]
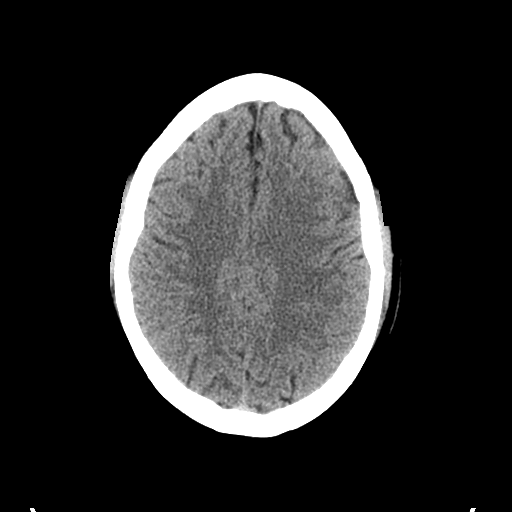
[im 22/28  brain]
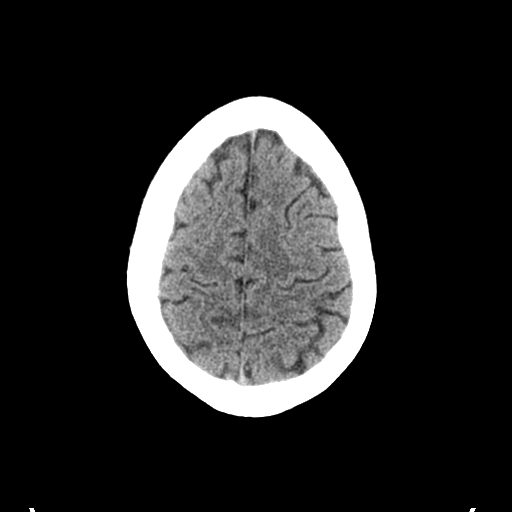
[im 25/28  brain]
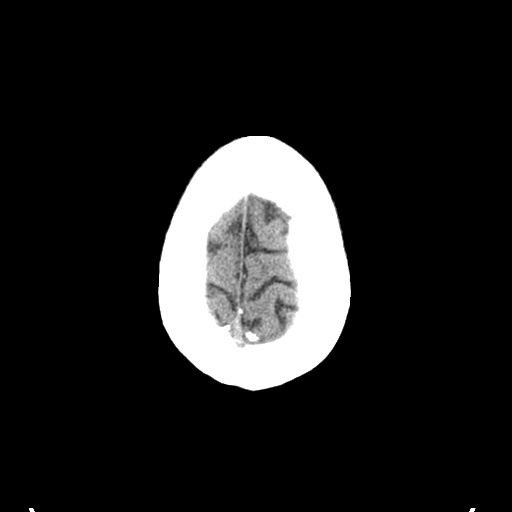
[im 25/28  bone]
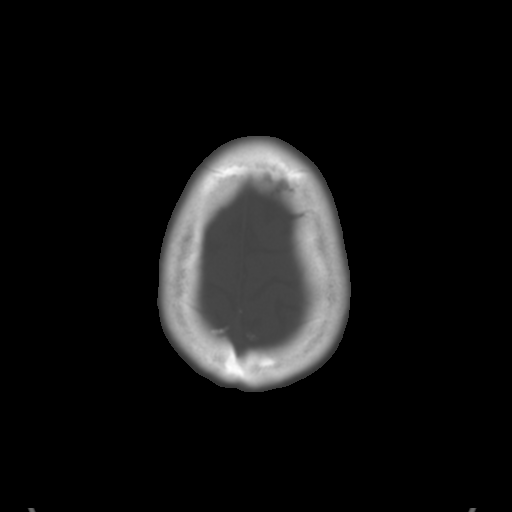

[Series 3: bone windows · axial · 0.48mm/px · z∈[-174,-69]mm · 8 of 46 slices shown]
[im 6/46  bone]
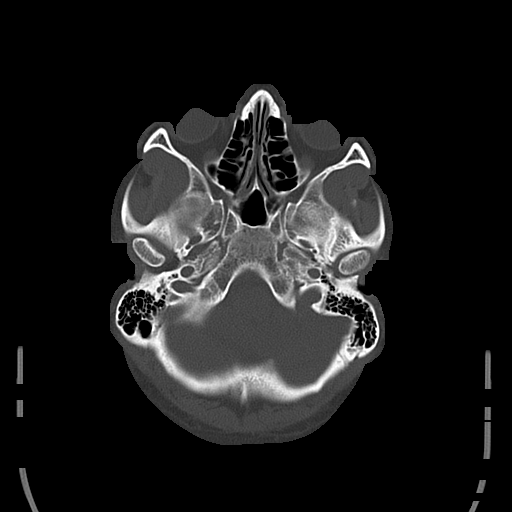
[im 11/46  bone]
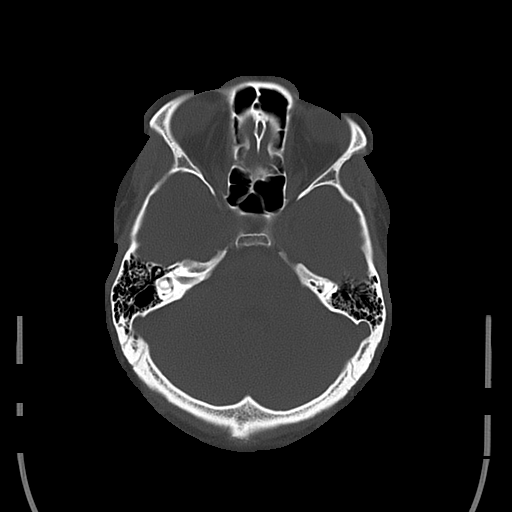
[im 16/46  bone]
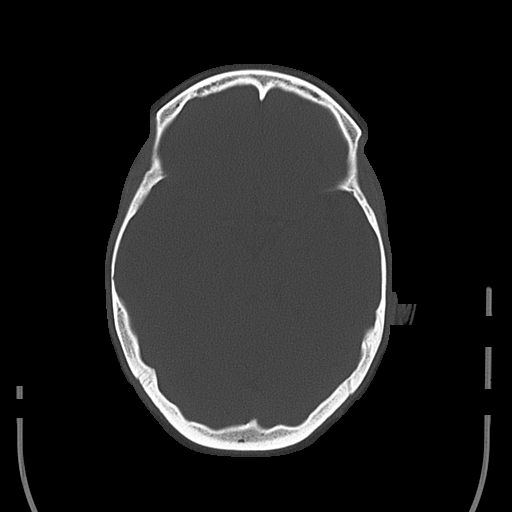
[im 21/46  bone]
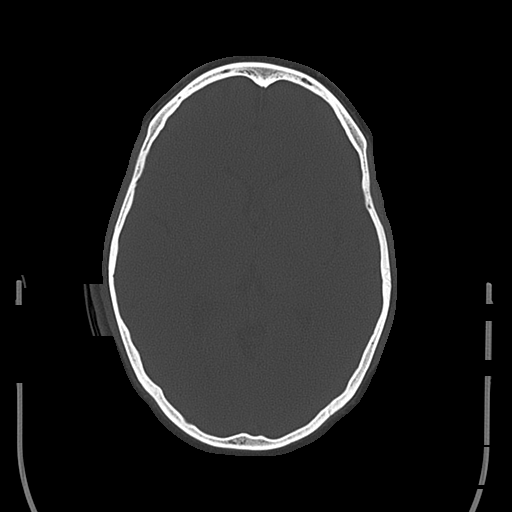
[im 26/46  bone]
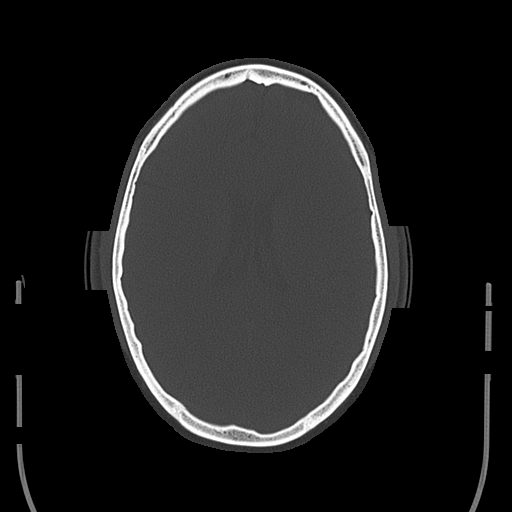
[im 31/46  bone]
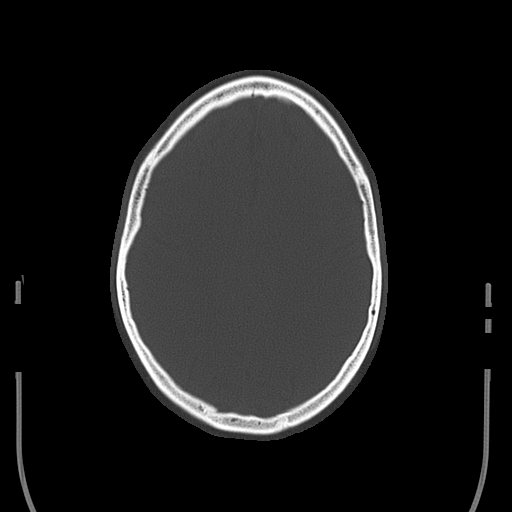
[im 36/46  bone]
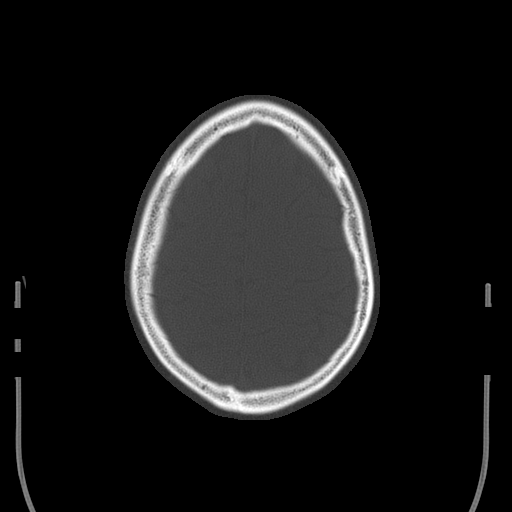
[im 41/46  bone]
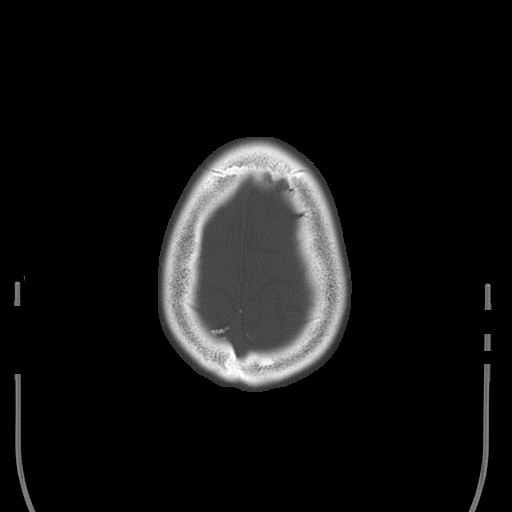

[17 of 30 positions shown; findings below may reference images not displayed]

FINDINGS: Negative for acute intracranial hemorrhage, acute infarction, mass,
mass effect, hydrocephalus or midline shift. Gray-white
differentiation is preserved throughout. No acute soft tissue or
calvarial abnormality. The globes and orbits are symmetric and
unremarkable. Normal aeration of the mastoid air cells and
visualized paranasal sinuses.
IMPRESSION: Negative head CT.

## 2015-07-23 IMAGING — US US ABDOMEN COMPLETE
1 series · 13 of 25 positions shown · non-contrast
Comparison: None.

CLINICAL DATA: Abdominal pain

EXAM:
ULTRASOUND ABDOMEN COMPLETE

[Series 1: us abdomen complete · 0.24mm/px · 13 of 81 slices shown]
[im 1/81]
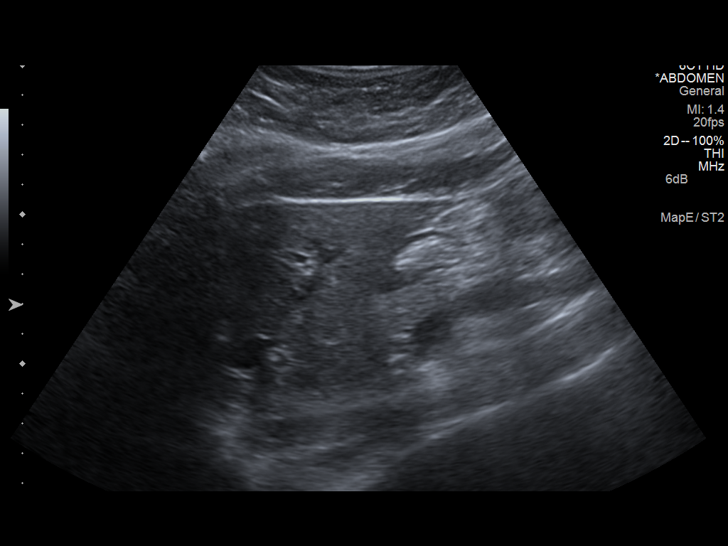
[im 7/81]
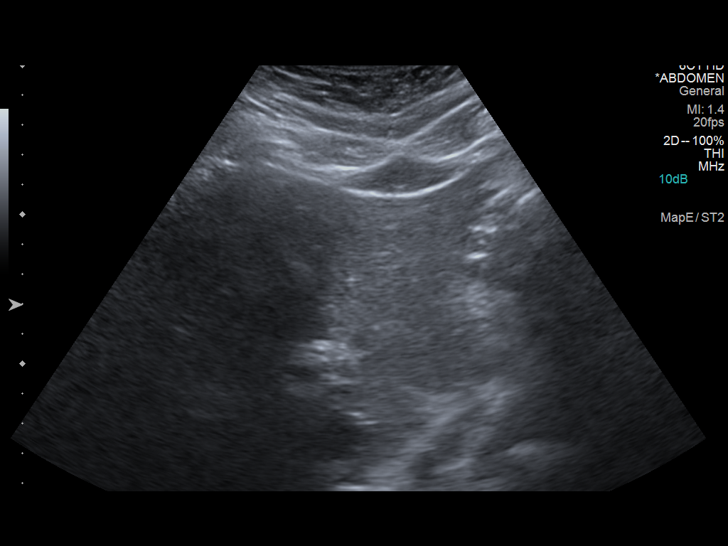
[im 14/81]
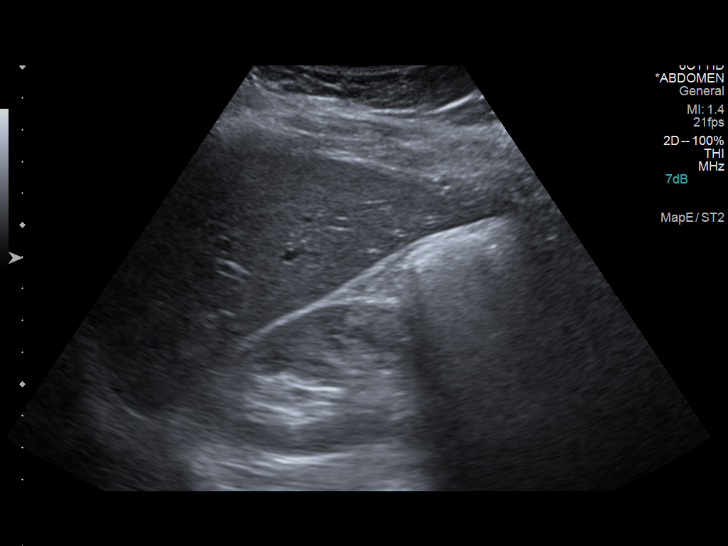
[im 21/81]
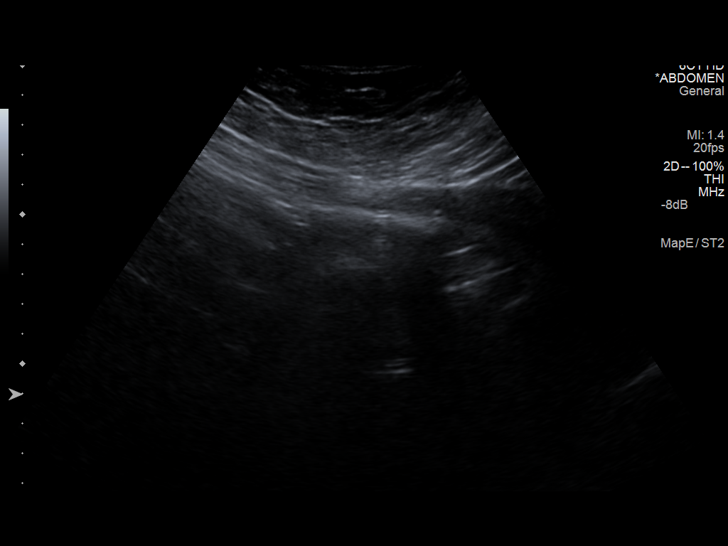
[im 27/81]
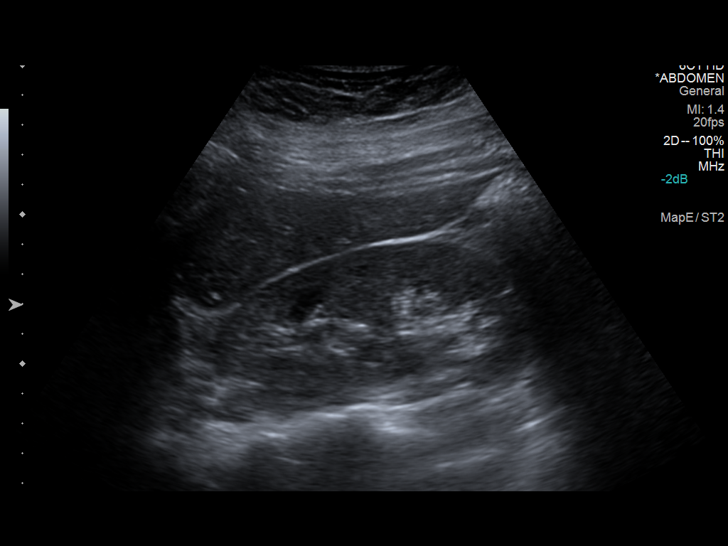
[im 34/81]
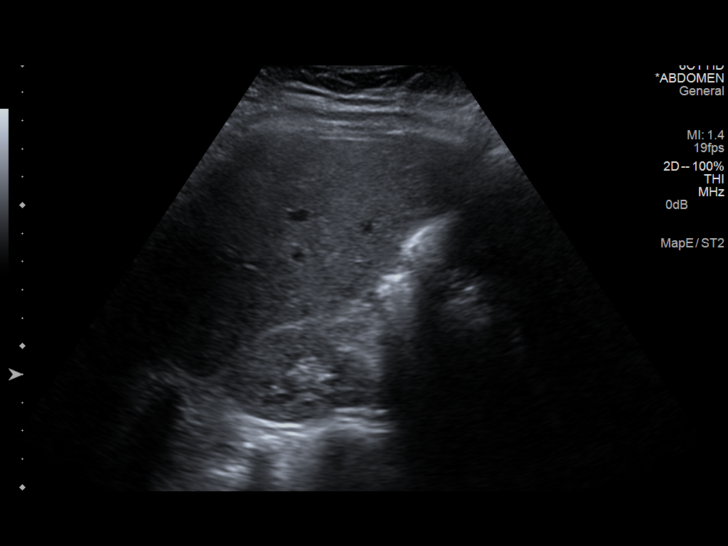
[im 41/81]
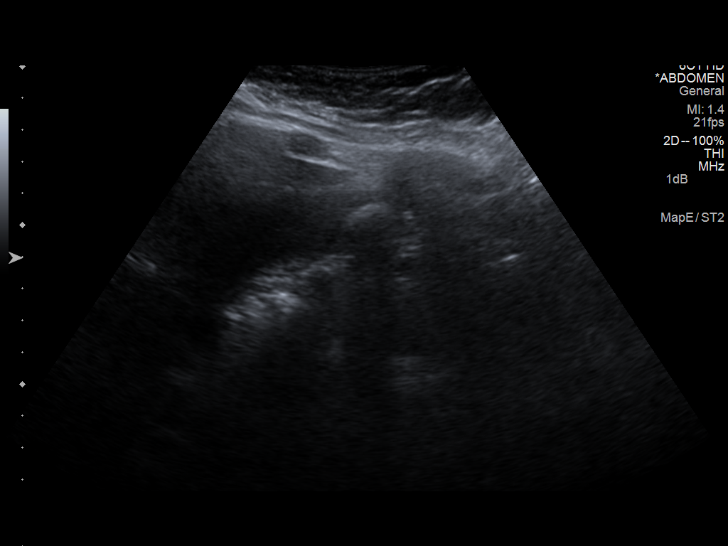
[im 47/81]
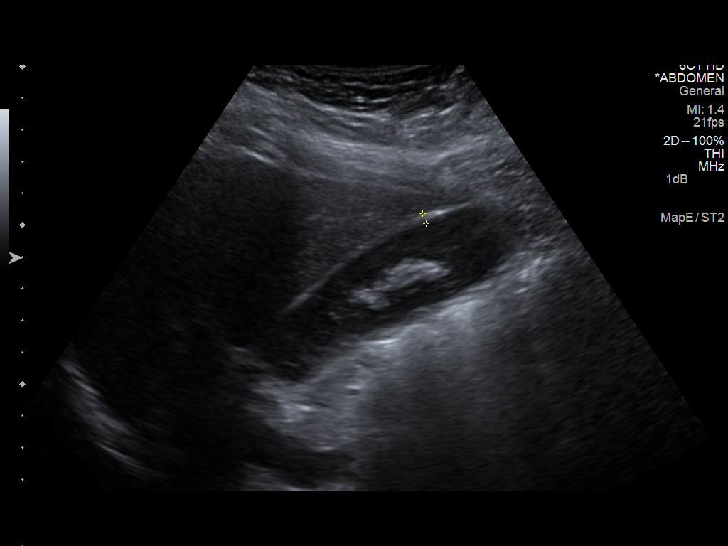
[im 54/81]
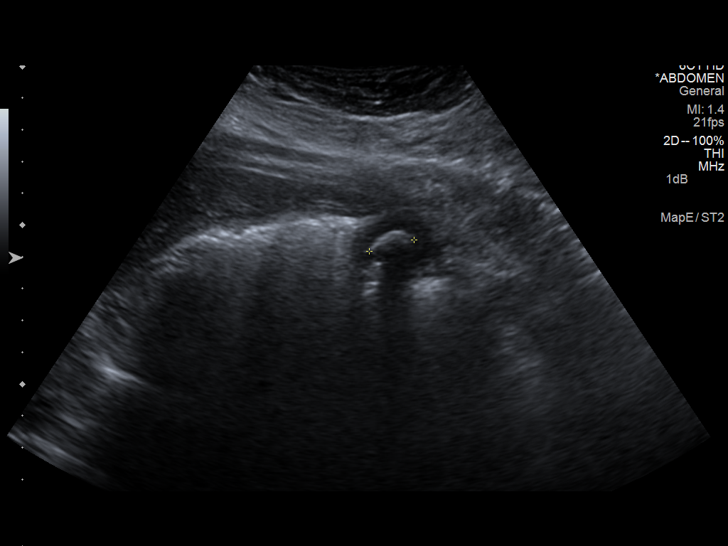
[im 61/81]
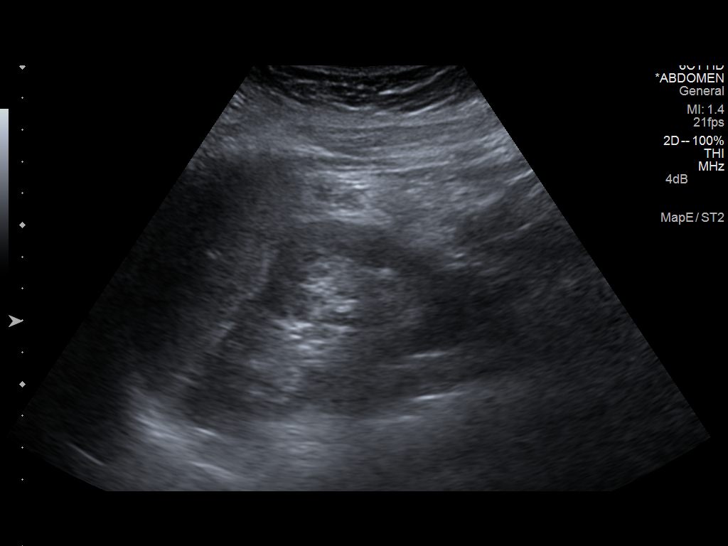
[im 67/81]
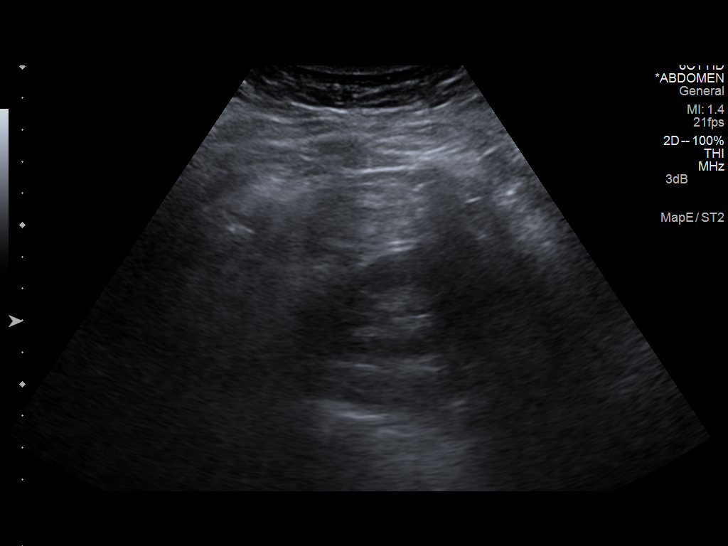
[im 74/81]
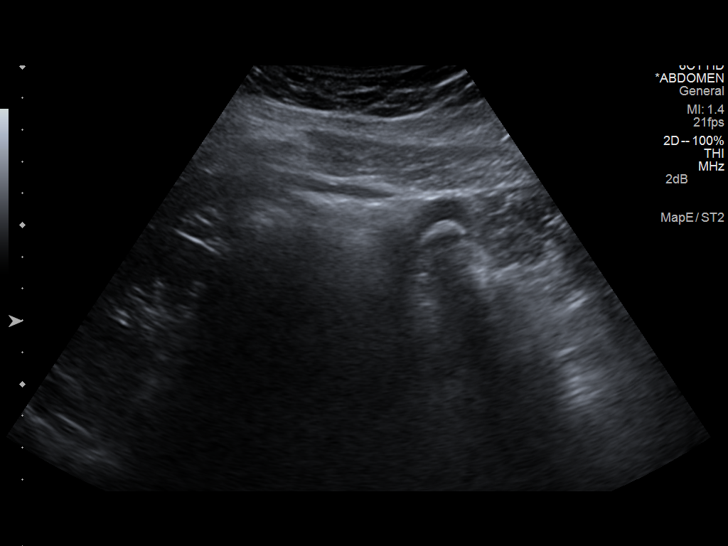
[im 81/81]
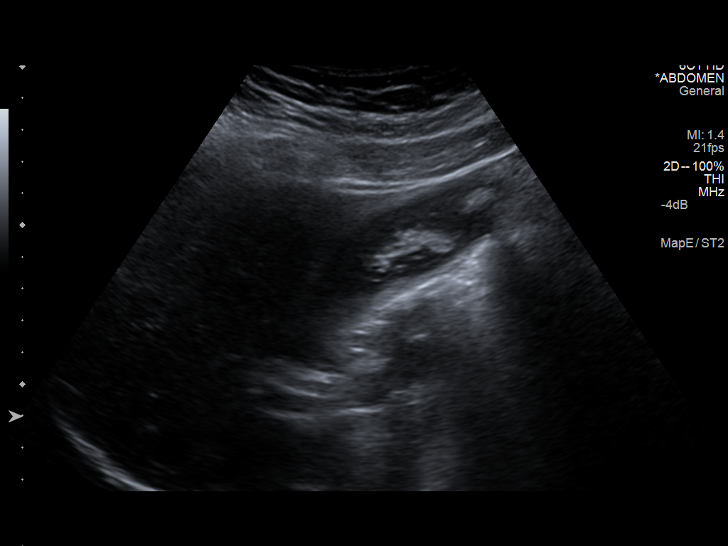

[13 of 25 positions shown; findings below may reference images not displayed]

FINDINGS: Gallbladder:

Within the gallbladder there are several echogenic foci which move
and shadow consistent with gallstones. Largest gallstone measures
1.5 cm in length. Sludge is also present in the gallbladder. The
gallbladder wall is mildly thickened. There is no pericholecystic
fluid. No sonographic Murphy sign noted.

Common bile duct:

Diameter: 4 mm. There is no intrahepatic, common hepatic, or common
bile duct dilatation.

Liver:

No focal lesion identified. Within normal limits in parenchymal
echogenicity.

IVC:

No abnormality visualized.

Pancreas:

Visualized portion unremarkable. Portions of pancreas are obscured
by gas.

Spleen:

Size and appearance within normal limits.

Right Kidney:

Length: 11.7 cm. Echogenicity within normal limits. No mass or
hydronephrosis visualized.

Left Kidney:

Length: 11.1 cm. Echogenicity within normal limits. No mass or
hydronephrosis visualized.

Abdominal aorta:

No aneurysm visualized.

Other findings:

No demonstrable ascites.
IMPRESSION: Cholelithiasis and sludge in gallbladder. Gallbladder wall is mildly
thickened. Early cholecystitis is questioned.

Portions of pancreas are obscured by gas. Visualized portions of
pancreas appear normal.

Study otherwise unremarkable.

## 2015-10-03 ENCOUNTER — Other Ambulatory Visit: Payer: Self-pay | Admitting: Advanced Practice Midwife

## 2015-10-03 DIAGNOSIS — Z3403 Encounter for supervision of normal first pregnancy, third trimester: Secondary | ICD-10-CM

## 2015-10-03 DIAGNOSIS — Z3689 Encounter for other specified antenatal screening: Secondary | ICD-10-CM
